# Patient Record
Sex: Male | Born: 1995 | Race: White | Hispanic: No | Marital: Single
Health system: Southern US, Community
[De-identification: ages and names within clinical notes are randomized; demographics above are authoritative.]

---

## 2018-07-21 ENCOUNTER — Emergency Department (HOSPITAL_COMMUNITY): Payer: BLUE CROSS/BLUE SHIELD

## 2018-07-21 ENCOUNTER — Encounter (HOSPITAL_COMMUNITY): Payer: Self-pay

## 2018-07-21 ENCOUNTER — Emergency Department (HOSPITAL_COMMUNITY)
Admission: EM | Admit: 2018-07-21 | Discharge: 2018-07-21 | Disposition: A | Discharge status: Home / Self Care | Payer: BLUE CROSS/BLUE SHIELD | Attending: Emergency Medicine | Admitting: Emergency Medicine

## 2018-07-21 DIAGNOSIS — R0789 Other chest pain: Secondary | ICD-10-CM | POA: Insufficient documentation

## 2018-07-21 DIAGNOSIS — R079 Chest pain, unspecified: Secondary | ICD-10-CM | POA: Diagnosis present

## 2018-07-21 NOTE — ED Provider Notes (Signed)
[facility] EMERGENCY DEPARTMENT Provider Note   CSN: [id number] Arrival date & time: 07/21/18  0907     History   Chief Complaint Chief Complaint  Patient presents with  . Chest Pain    HPI Anthem Butikofer is a 23 y.o. male.  22yo male presents with complaint of discomfort/awareness of left side chest and left arm discomfort intermittent for the past 2 days.  Patient reports currently he is aware of a sensation in his left anterior forearm and left mid axillary chest.  Patient states at times he has a pressure in his left back or various other parts of the left side of his chest or his left arm.  This is not associated with shortness of breath, diaphoresis, nausea.  Symptoms are not exacerbated by exertion, nothing makes the symptoms better or worse.  Patient reports similar discomfort or awareness in the past however states this only lasted a few minutes and resolved versus today's episode which is been intermittent for the past 2 days.  Patient is in the emergency room today with his girlfriends father, states they are in town for a funeral and unsure if the family dynamic is causing stress and resulting in patient's symptoms today.  Patient states that he noticed tingling in his tongue and felt short of breath today which he associates with anxiety, the symptoms have resolved.  No other complaints or concerns.     History reviewed. No pertinent past medical history.  There are no active problems to display for this patient.   History reviewed. No pertinent surgical history.      Home Medications    Prior to Admission medications   Not on File    Family History No family history on file.  Social History Social History   Tobacco Use  . Smoking status: Not on file  Substance Use Topics  . Alcohol use: Not on file  . Drug use: Not on file     Allergies   Patient has no known allergies.   Review of Systems Review of Systems    Constitutional: Negative for chills and fever.  Respiratory: Negative for chest tightness and shortness of breath.   Cardiovascular: Positive for chest pain. Negative for palpitations and leg swelling.  Gastrointestinal: Negative for abdominal pain, nausea and vomiting.  Skin: Negative for rash and wound.  Allergic/Immunologic: Negative for immunocompromised state.  Neurological: Negative for dizziness and weakness.  Hematological: Negative for adenopathy.  Psychiatric/Behavioral: Negative for confusion.  All other systems reviewed and are negative.    Physical Exam Updated Vital Signs BP (!) 148/105 (BP Location: Right Arm)   Pulse 78   Resp 16   SpO2 98%   Physical Exam Vitals signs and nursing note reviewed.  Constitutional:      General: He is not in acute distress.    Appearance: He is well-developed. He is not diaphoretic.  HENT:     Head: Normocephalic and atraumatic.  Neck:     Musculoskeletal: Neck supple.  Cardiovascular:     Rate and Rhythm: Normal rate and regular rhythm.     Heart sounds: Normal heart sounds. Heart sounds not distant. No murmur.  Pulmonary:     Effort: Pulmonary effort is normal.     Breath sounds: Normal breath sounds.  Chest:     Chest wall: No tenderness.  Abdominal:     Tenderness: There is no abdominal tenderness.  Skin:    General: Skin is warm and dry.  Findings: No erythema or rash.  Neurological:     Mental Status: He is alert and oriented to person, place, and time.  Psychiatric:        Behavior: Behavior normal.      ED Treatments / Results  Labs (all labs ordered are listed, but only abnormal results are displayed) Labs Reviewed - No data to display  EKG EKG Interpretation  Date/Time:  Sunday July 21 2018 09:39:29 EST Ventricular Rate:  80 PR Interval:  154 QRS Duration: 94 QT Interval:  348 QTC Calculation: 401 R Axis:   13 Text Interpretation:  Normal sinus rhythm with sinus arrhythmia Possible  Anterior infarct , age undetermined Abnormal ECG No old tracing to compare Confirmed by Rolan Bucco [phone]) on 07/21/2018 11:08:20 AM   Radiology Dg Chest 2 View  Result Date: 07/21/2018 CLINICAL DATA:  Chest pain EXAM: CHEST - 2 VIEW COMPARISON:  None. FINDINGS: Lungs are clear. Heart size and pulmonary vascularity are normal. No adenopathy. There is an azygos lobe on the right, an anatomic variant. No bone lesions. IMPRESSION: No edema or consolidation. Electronically Signed   By: Bretta Bang III M.D.   On: 07/21/2018 10:56    Procedures Procedures (including critical care time)  Medications Ordered in ED Medications - No data to display   Initial Impression / Assessment and Plan / ED Course  I have reviewed the triage vital signs and the nursing notes.  Pertinent labs & imaging results that were available during my care of the patient were reviewed by me and considered in my medical decision making (see chart for details).  Clinical Course as of Jul 21 1138  Sun Jul 21, 2018  2327 23 year old male with complaint of left side chest, back, arm discomfort x2 days.  Location of discomfort varies.  No associated symptoms. EKG without significant findings, CXR normal. Patient is in town visiting for a funeral with his girlfriend's family, believes symptoms may be stress related. Case discussed with Dr. Fredderick Phenix, ER attending, has reviewed EKG, agrees with POC to dc home, follow up with PCP, return to ER as needed.   [LM]    Clinical Course User Index [LM] Jeannie Fend, PA-C   Final Clinical Impressions(s) / ED Diagnoses   Final diagnoses:  Nonspecific chest pain    ED Discharge Orders    None       Jeannie Fend, PA-C 07/21/18 1140    Rolan Bucco, MD 07/21/18 1215

## 2018-07-21 NOTE — ED Triage Notes (Signed)
Pt presents for evaluation of L sided chest pain with radiation to L arm x 2 days. States had a dry cough last night. Pt reports has had similar pain in the past but didn't last this long. Takes vyvanse for ADHD.

## 2018-07-21 NOTE — ED Notes (Signed)
Patient transported to X-ray 

## 2018-07-21 NOTE — Discharge Instructions (Addendum)
Your chest x-ray and EKG are reassuring. Follow up with your doctor for further evaluation if symptoms continue. Return to the ER for new or worsening symptoms.

## 2019-12-01 IMAGING — DX DG CHEST 2V
2 series · 2 of 2 positions shown · non-contrast
Comparison: None.

CLINICAL DATA: Chest pain

EXAM:
CHEST - 2 VIEW

[chest pa]
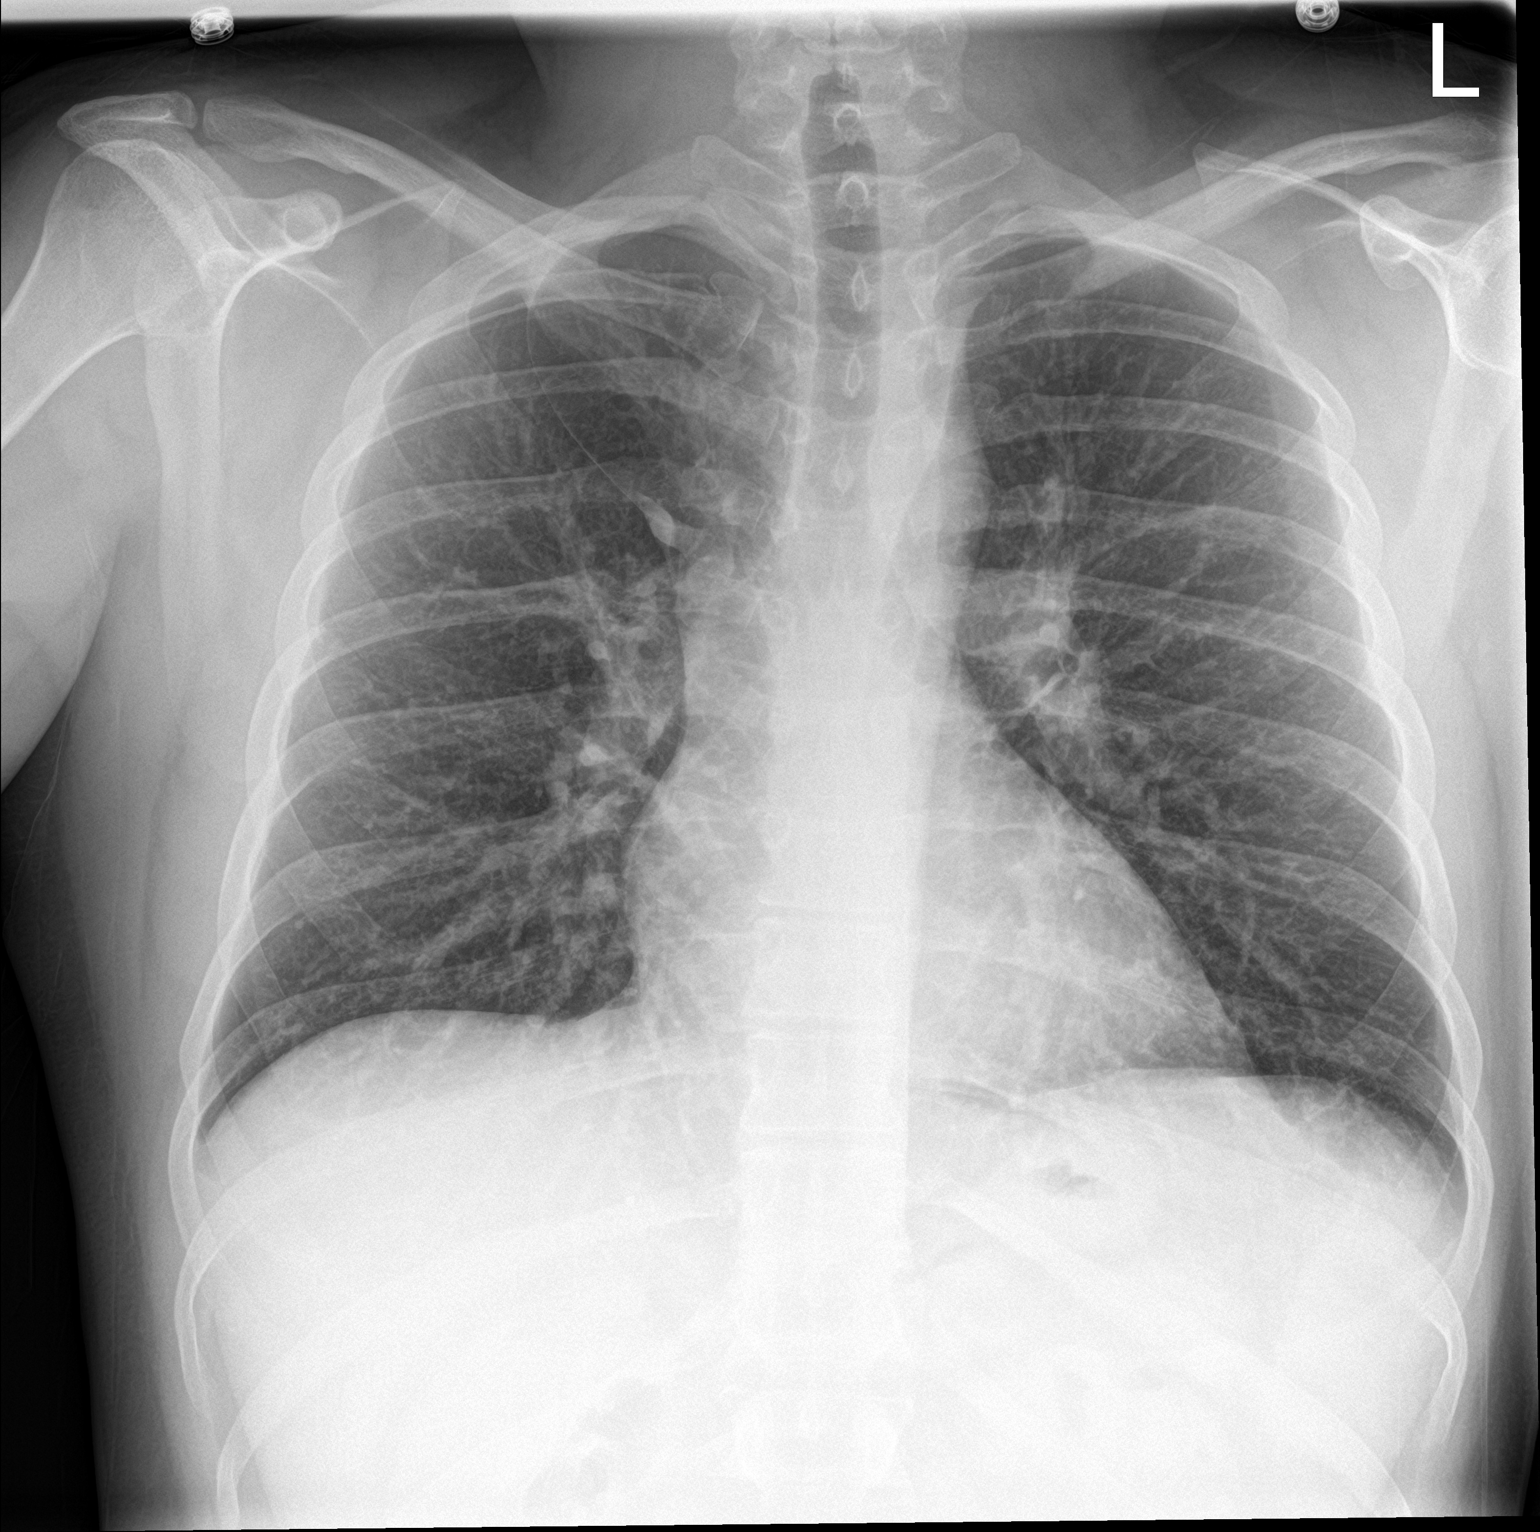

[chest lat]
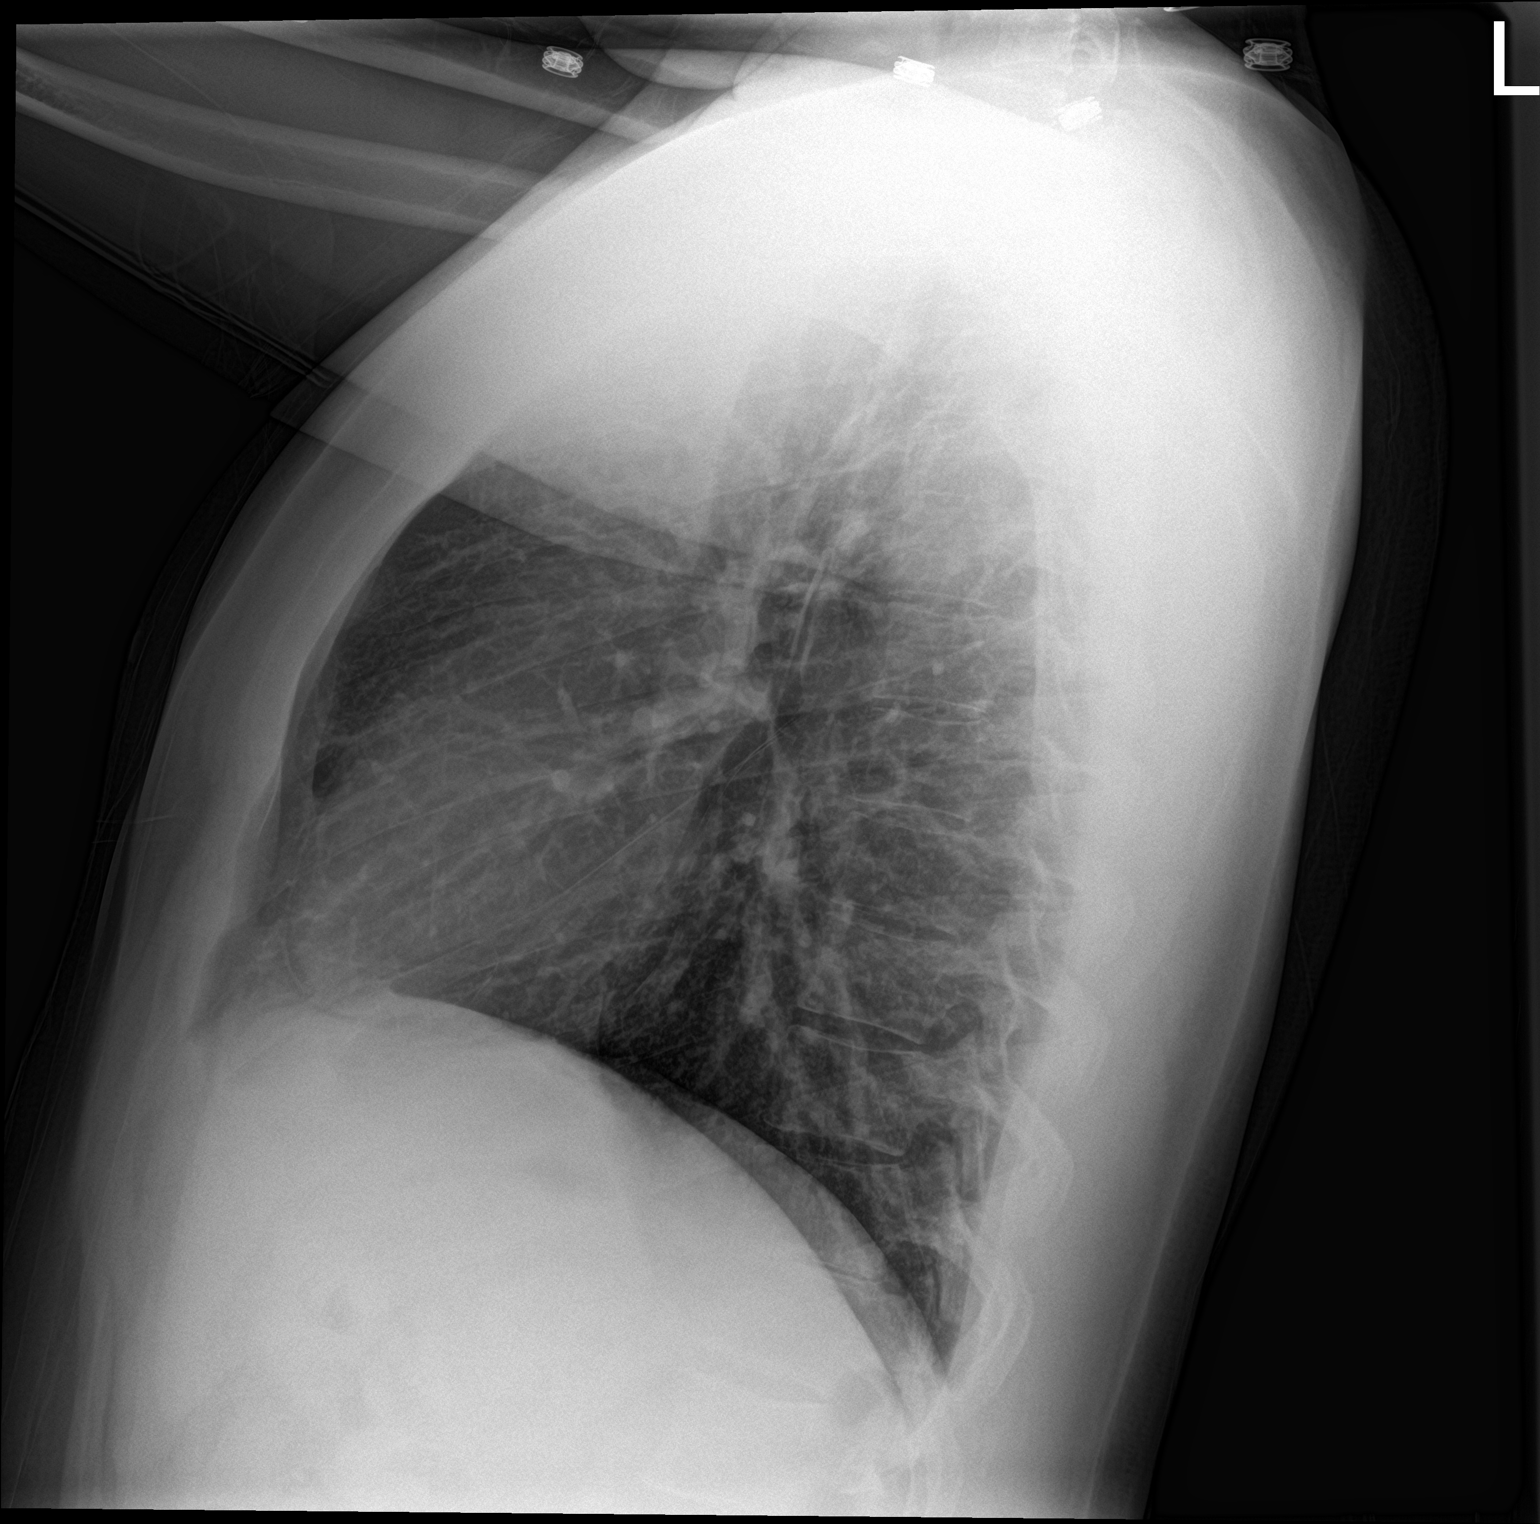

[2 of 2 positions shown; findings below may reference images not displayed]

FINDINGS: Lungs are clear. Heart size and pulmonary vascularity are normal. No
adenopathy. There is an azygos lobe on the right, an anatomic
variant. No bone lesions.
IMPRESSION: No edema or consolidation.
